# Patient Record
Sex: Male | Born: 1985 | Race: Black or African American | Hispanic: No | Marital: Married | Smoking: Current every day smoker
Health system: Southern US, Community
[De-identification: ages and names within clinical notes are randomized; demographics above are authoritative.]

---

## 2015-04-17 ENCOUNTER — Emergency Department
Admission: EM | Admit: 2015-04-17 | Discharge: 2015-04-17 | Disposition: A | Discharge status: Home / Self Care | Payer: No Typology Code available for payment source | Attending: Emergency Medicine | Admitting: Emergency Medicine

## 2015-04-17 ENCOUNTER — Encounter: Payer: Self-pay | Admitting: Emergency Medicine

## 2015-04-17 ENCOUNTER — Emergency Department: Payer: No Typology Code available for payment source

## 2015-04-17 DIAGNOSIS — Z720 Tobacco use: Secondary | ICD-10-CM | POA: Insufficient documentation

## 2015-04-17 DIAGNOSIS — S3991XA Unspecified injury of abdomen, initial encounter: Secondary | ICD-10-CM | POA: Insufficient documentation

## 2015-04-17 DIAGNOSIS — Y92410 Unspecified street and highway as the place of occurrence of the external cause: Secondary | ICD-10-CM | POA: Diagnosis not present

## 2015-04-17 DIAGNOSIS — Y9389 Activity, other specified: Secondary | ICD-10-CM | POA: Diagnosis not present

## 2015-04-17 DIAGNOSIS — Y998 Other external cause status: Secondary | ICD-10-CM | POA: Diagnosis not present

## 2015-04-17 DIAGNOSIS — S335XXA Sprain of ligaments of lumbar spine, initial encounter: Secondary | ICD-10-CM | POA: Diagnosis not present

## 2015-04-17 DIAGNOSIS — S3992XA Unspecified injury of lower back, initial encounter: Secondary | ICD-10-CM | POA: Diagnosis present

## 2015-04-17 LAB — COMPREHENSIVE METABOLIC PANEL
ALBUMIN: 4 g/dL (ref 3.5–5.0)
ALK PHOS: 47 U/L (ref 38–126)
ALT: 9 U/L — ABNORMAL LOW (ref 17–63)
AST: 21 U/L (ref 15–41)
Anion gap: 6 (ref 5–15)
BILIRUBIN TOTAL: 0.7 mg/dL (ref 0.3–1.2)
BUN: 17 mg/dL (ref 6–20)
CALCIUM: 8.9 mg/dL (ref 8.9–10.3)
CO2: 29 mmol/L (ref 22–32)
Chloride: 105 mmol/L (ref 101–111)
Creatinine, Ser: 1.25 mg/dL — ABNORMAL HIGH (ref 0.61–1.24)
GFR calc Af Amer: >60 mL/min (ref >60)
GFR calc non Af Amer: >60 mL/min (ref >60)
GLUCOSE: 75 mg/dL (ref 65–99)
Potassium: 4.1 mmol/L (ref 3.5–5.1)
Sodium: 140 mmol/L (ref 135–145)
TOTAL PROTEIN: 6.8 g/dL (ref 6.5–8.1)

## 2015-04-17 LAB — CBC
HEMATOCRIT: 45 % (ref 40.0–52.0)
HEMOGLOBIN: 15.7 g/dL (ref 13.0–18.0)
MCH: 30.6 pg (ref 26.0–34.0)
MCHC: 34.8 g/dL (ref 32.0–36.0)
MCV: 87.9 fL (ref 80.0–100.0)
Platelets: 194 10*3/uL (ref 150–440)
RBC: 5.12 MIL/uL (ref 4.40–5.90)
RDW: 12.8 % (ref 11.5–14.5)
WBC: 7.4 10*3/uL (ref 3.8–10.6)

## 2015-04-17 LAB — APTT: aPTT: 29 seconds (ref 24–36)

## 2015-04-17 LAB — PROTIME-INR
INR: 1
Prothrombin Time: 13.4 seconds (ref 11.4–15.0)

## 2015-04-17 MED ORDER — IOHEXOL 300 MG/ML  SOLN
100.0000 mL | Freq: Once | INTRAMUSCULAR | Status: AC | PRN
  Administered 2015-04-17: 100 mL via INTRAVENOUS
  Filled 2015-04-17: qty 100

## 2015-04-17 MED ORDER — IOHEXOL 300 MG/ML  SOLN
100.0000 mL | Freq: Once | INTRAMUSCULAR | Status: DC | PRN
  Filled 2015-04-17: qty 100

## 2015-04-17 MED ORDER — NAPROXEN 500 MG PO TABS: 500.0000 mg | ORAL_TABLET | Freq: Two times a day (BID) | ORAL | Status: AC
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
## 2015-04-17 NOTE — Discharge Instructions (Signed)
Back Pain, Adult Low back pain is very common. About 1 in 5 people have back pain.The cause of low back pain is rarely dangerous. The pain often gets better over time.About half of people with a sudden onset of back pain feel better in just 2 weeks. About 8 in 10 people feel better by 6 weeks.  CAUSES Some common causes of back pain include:  Strain of the muscles or ligaments supporting the spine.  Wear and tear (degeneration) of the spinal discs.  Arthritis.  Direct injury to the back. DIAGNOSIS Most of the time, the direct cause of low back pain is not known.However, back pain can be treated effectively even when the exact cause of the pain is unknown.Answering your caregiver's questions about your overall health and symptoms is one of the most accurate ways to make sure the cause of your pain is not dangerous. If your caregiver needs more information, he or she may order lab work or imaging tests (X-rays or MRIs).However, even if imaging tests show changes in your back, this usually does not require surgery. HOME CARE INSTRUCTIONS For many people, back pain returns.Since low back pain is rarely dangerous, it is often a condition that people can learn to manageon their own.   Remain active. It is stressful on the back to sit or stand in one place. Do not sit, drive, or stand in one place for more than 30 minutes at a time. Take short walks on level surfaces as soon as pain allows.Try to increase the length of time you walk each day.  Do not stay in bed.Resting more than 1 or 2 days can delay your recovery.  Do not avoid exercise or work.Your body is made to move.It is not dangerous to be active, even though your back may hurt.Your back will likely heal faster if you return to being active before your pain is gone.  Pay attention to your body when you bend and lift. Many people have less discomfortwhen lifting if they bend their knees, keep the load close to their bodies,and  avoid twisting. Often, the most comfortable positions are those that put less stress on your recovering back.  Find a comfortable position to sleep. Use a firm mattress and lie on your side with your knees slightly bent. If you lie on your back, put a pillow under your knees.  Only take over-the-counter or prescription medicines as directed by your caregiver. Over-the-counter medicines to reduce pain and inflammation are often the most helpful.Your caregiver may prescribe muscle relaxant drugs.These medicines help dull your pain so you can more quickly return to your normal activities and healthy exercise.  Put ice on the injured area.  Put ice in a plastic bag.  Place a towel between your skin and the bag.  Leave the ice on for 15-20 minutes, 03-04 times a day for the first 2 to 3 days. After that, ice and heat may be alternated to reduce pain and spasms.  Ask your caregiver about trying back exercises and gentle massage. This may be of some benefit.  Avoid feeling anxious or stressed.Stress increases muscle tension and can worsen back pain.It is important to recognize when you are anxious or stressed and learn ways to manage it.Exercise is a great option. SEEK MEDICAL CARE IF:  You have pain that is not relieved with rest or medicine.  You have pain that does not improve in 1 week.  You have new symptoms.  You are generally not feeling well. SEEK   IMMEDIATE MEDICAL CARE IF:   You have pain that radiates from your back into your legs.  You develop new bowel or bladder control problems.  You have unusual weakness or numbness in your arms or legs.  You develop nausea or vomiting.  You develop abdominal pain.  You feel faint. Document Released: 07/15/2005 Document Revised: 01/14/2012 Document Reviewed: 11/16/2013 [employer] Patient Information 2015 [employer]. This information is not intended to replace advice given to you by your health care provider. Make sure you  discuss any questions you have with your health care provider.  
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
